# Patient Record
Sex: Male | Born: 1990 | Race: Black or African American | Hispanic: No | Marital: Single | Smoking: Current every day smoker
Health system: Southern US, Community
[De-identification: ages and names within clinical notes are randomized; demographics above are authoritative.]

---
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
## 2011-01-27 ENCOUNTER — Emergency Department (HOSPITAL_COMMUNITY): Payer: Self-pay

## 2011-01-27 ENCOUNTER — Emergency Department (HOSPITAL_COMMUNITY)
Admission: EM | Admit: 2011-01-27 | Discharge: 2011-01-28 | Disposition: A | Discharge status: Home / Self Care | Payer: Self-pay | Attending: Emergency Medicine | Admitting: Emergency Medicine

## 2011-01-27 DIAGNOSIS — J45909 Unspecified asthma, uncomplicated: Secondary | ICD-10-CM | POA: Insufficient documentation

## 2011-01-27 DIAGNOSIS — S0010XA Contusion of unspecified eyelid and periocular area, initial encounter: Secondary | ICD-10-CM | POA: Insufficient documentation

## 2011-01-27 DIAGNOSIS — R51 Headache: Secondary | ICD-10-CM | POA: Insufficient documentation

## 2011-01-27 DIAGNOSIS — R4182 Altered mental status, unspecified: Secondary | ICD-10-CM | POA: Insufficient documentation

## 2011-01-27 DIAGNOSIS — S060X9A Concussion with loss of consciousness of unspecified duration, initial encounter: Secondary | ICD-10-CM | POA: Insufficient documentation

## 2011-01-27 LAB — CBC
HCT: 40.9 % (ref 39.0–52.0)
MCV: 89.7 fL (ref 78.0–100.0)
Platelets: 196 10*3/uL (ref 150–400)
RBC: 4.56 MIL/uL (ref 4.22–5.81)
RDW: 12.7 % (ref 11.5–15.5)
WBC: 16.8 10*3/uL — ABNORMAL HIGH (ref 4.0–10.5)

## 2011-01-27 LAB — POCT I-STAT, CHEM 8
Chloride: 102 mEq/L (ref 96–112)
HCT: 44 % (ref 39.0–52.0)
Hemoglobin: 15 g/dL (ref 13.0–17.0)
Potassium: 3.8 mEq/L (ref 3.5–5.1)
Sodium: 139 mEq/L (ref 135–145)

## 2011-01-27 LAB — RAPID URINE DRUG SCREEN, HOSP PERFORMED
Amphetamines: NOT DETECTED
Barbiturates: NOT DETECTED
Cocaine: NOT DETECTED
Opiates: NOT DETECTED
Tetrahydrocannabinol: POSITIVE — AB

## 2011-01-27 LAB — DIFFERENTIAL
Basophils Absolute: 0 10*3/uL (ref 0.0–0.1)
Eosinophils Relative: 0 % (ref 0–5)
Lymphocytes Relative: 5 % — ABNORMAL LOW (ref 12–46)
Lymphs Abs: 0.9 10*3/uL (ref 0.7–4.0)
Neutrophils Relative %: 89 % — ABNORMAL HIGH (ref 43–77)

## 2011-04-24 ENCOUNTER — Emergency Department (HOSPITAL_COMMUNITY): Payer: Self-pay

## 2011-04-24 ENCOUNTER — Emergency Department (HOSPITAL_COMMUNITY)
Admission: EM | Admit: 2011-04-24 | Discharge: 2011-04-25 | Disposition: A | Discharge status: Home / Self Care | Payer: Self-pay | Attending: Emergency Medicine | Admitting: Emergency Medicine

## 2011-04-24 DIAGNOSIS — F911 Conduct disorder, childhood-onset type: Secondary | ICD-10-CM | POA: Insufficient documentation

## 2011-04-24 DIAGNOSIS — S62309A Unspecified fracture of unspecified metacarpal bone, initial encounter for closed fracture: Secondary | ICD-10-CM | POA: Insufficient documentation

## 2011-04-24 DIAGNOSIS — R443 Hallucinations, unspecified: Secondary | ICD-10-CM | POA: Insufficient documentation

## 2011-04-24 DIAGNOSIS — W2209XA Striking against other stationary object, initial encounter: Secondary | ICD-10-CM | POA: Insufficient documentation

## 2011-04-24 LAB — ETHANOL: Alcohol, Ethyl (B): <11 mg/dL (ref 0–11)

## 2011-04-24 LAB — COMPREHENSIVE METABOLIC PANEL
AST: 20 U/L (ref 0–37)
Albumin: 4.1 g/dL (ref 3.5–5.2)
BUN: 14 mg/dL (ref 6–23)
Calcium: 9.8 mg/dL (ref 8.4–10.5)
Creatinine, Ser: 0.76 mg/dL (ref 0.50–1.35)
Total Protein: 7.3 g/dL (ref 6.0–8.3)

## 2011-04-24 LAB — CBC
HCT: 41.7 % (ref 39.0–52.0)
MCHC: 35.5 g/dL (ref 30.0–36.0)
MCV: 88.9 fL (ref 78.0–100.0)
Platelets: 211 10*3/uL (ref 150–400)
RDW: 12.3 % (ref 11.5–15.5)

## 2011-04-24 LAB — RAPID URINE DRUG SCREEN, HOSP PERFORMED
Amphetamines: NOT DETECTED
Benzodiazepines: NOT DETECTED
Cocaine: NOT DETECTED
Opiates: NOT DETECTED

## 2011-04-24 LAB — DIFFERENTIAL
Eosinophils Absolute: 0.4 10*3/uL (ref 0.0–0.7)
Eosinophils Relative: 7 % — ABNORMAL HIGH (ref 0–5)
Lymphocytes Relative: 34 % (ref 12–46)
Lymphs Abs: 1.8 10*3/uL (ref 0.7–4.0)
Monocytes Absolute: 0.8 10*3/uL (ref 0.1–1.0)

## 2011-12-24 IMAGING — CT CT HEAD W/O CM
2 series · 16 of 30 positions shown, 18 images · non-contrast
Comparison: 05/08/2009

CLINICAL DATA: Trauma.  Confusion.  Hematoma about left eye.

CT HEAD WITHOUT CONTRAST
TECHNIQUE: Contiguous axial images were obtained from the base of
the skull through the vertex without contrast.

[Series 2: head w/o · axial · non-contrast · 0.49mm/px · z∈[+120,+240]mm · 8 of 32 slices shown, 10 images]
[im 4/32  brain]
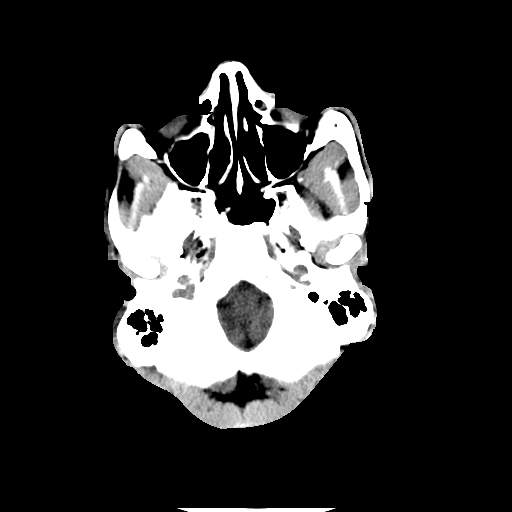
[im 4/32  bone]
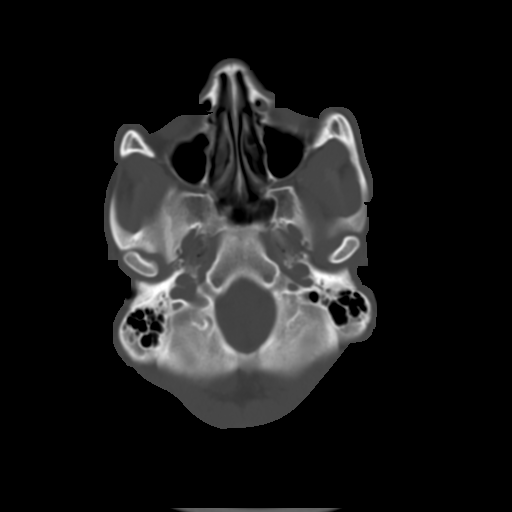
[im 7/32  brain]
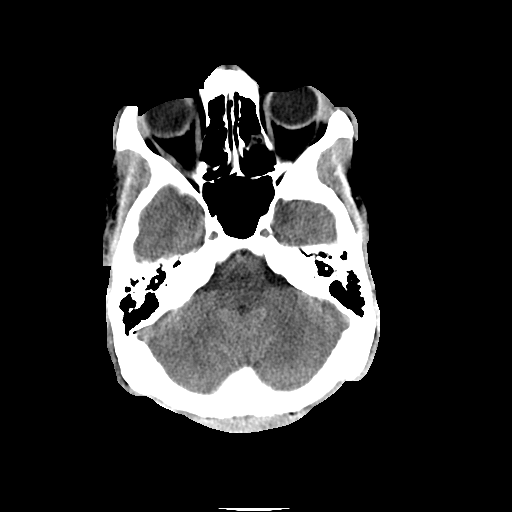
[im 11/32  brain]
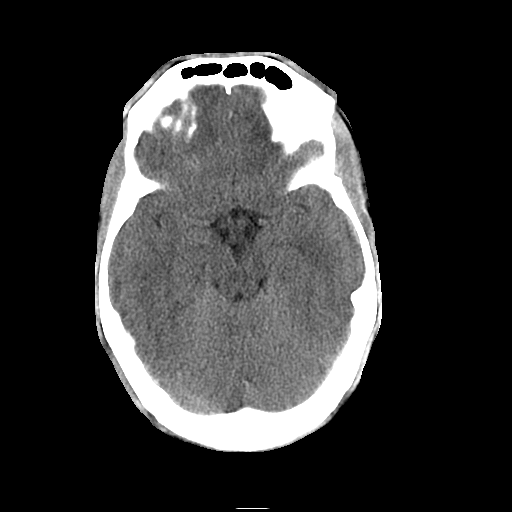
[im 14/32  brain]
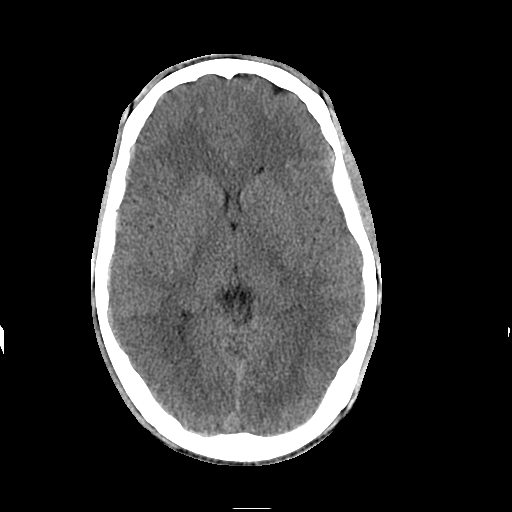
[im 18/32  brain]
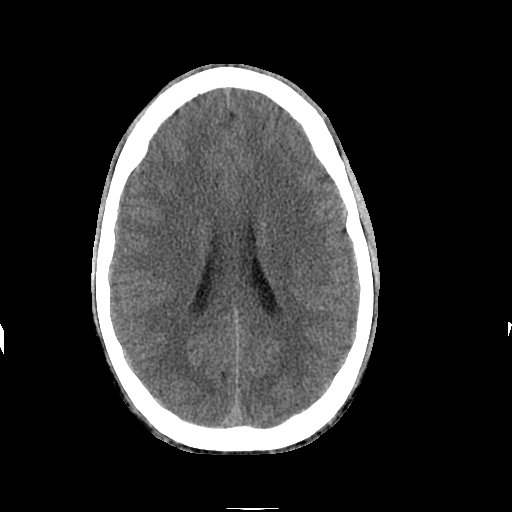
[im 18/32  bone]
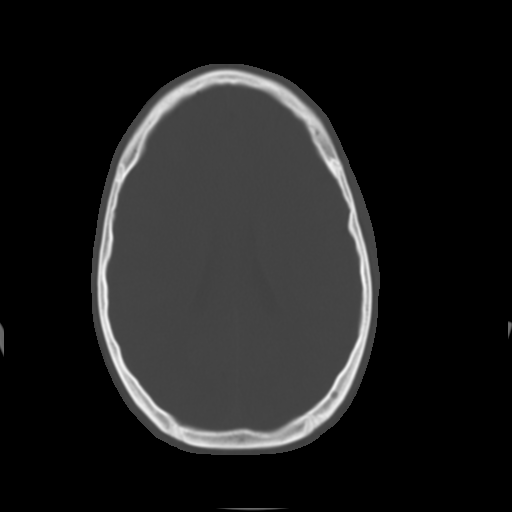
[im 21/32  brain]
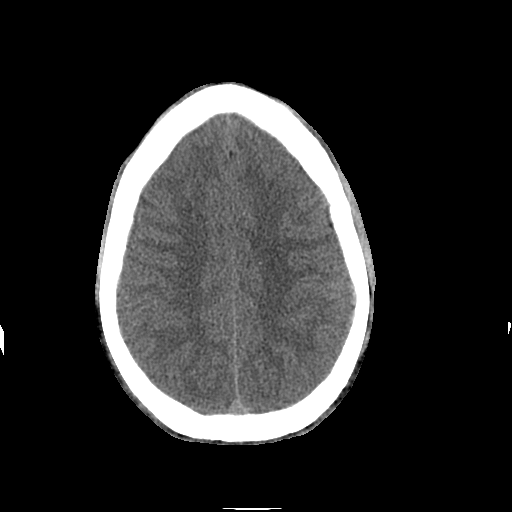
[im 25/32  brain]
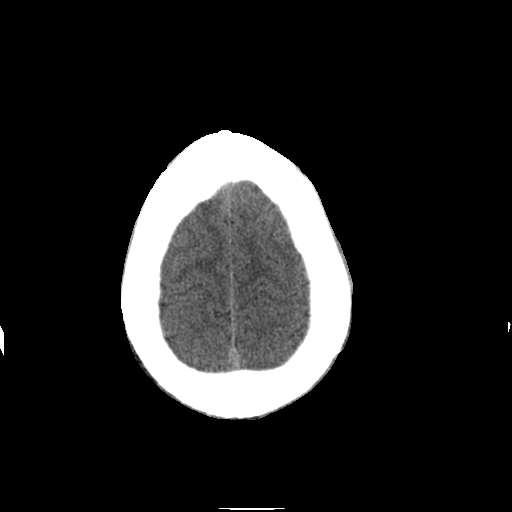
[im 28/32  brain]
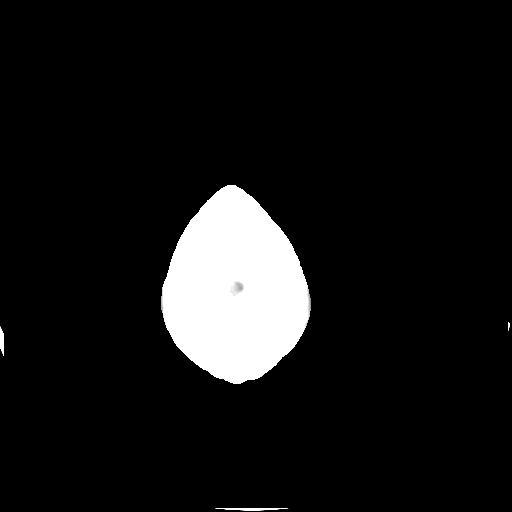

[Series 3: head w/o bone · axial · non-contrast · 0.49mm/px · z∈[+120,+242]mm · 8 of 63 slices shown]
[im 7/63  bone]
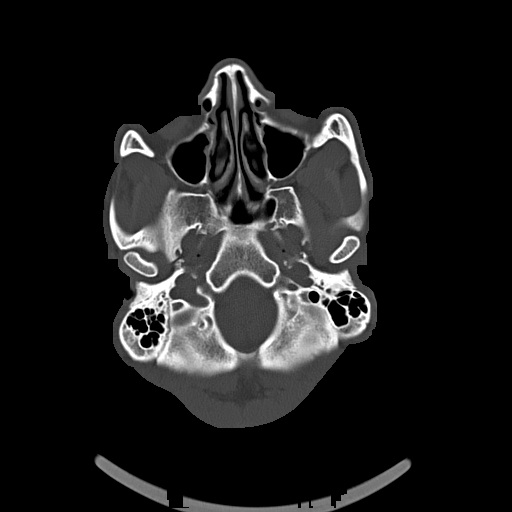
[im 14/63  bone]
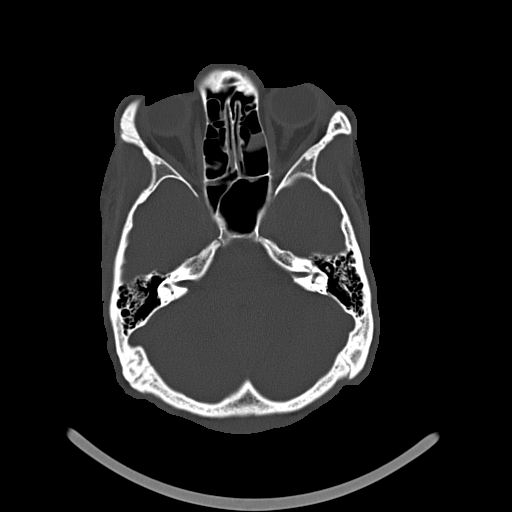
[im 20/63  bone]
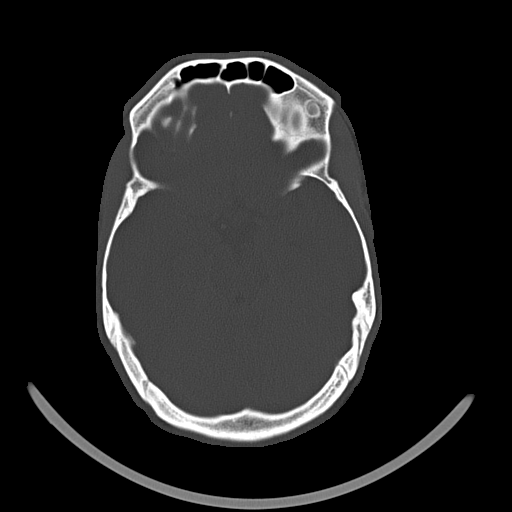
[im 27/63  bone]
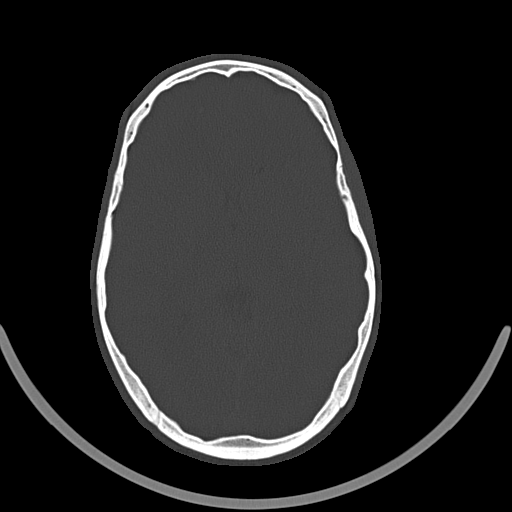
[im 36/63  bone]
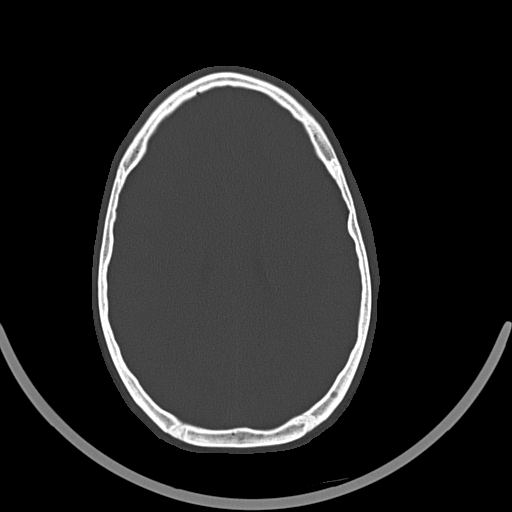
[im 43/63  bone]
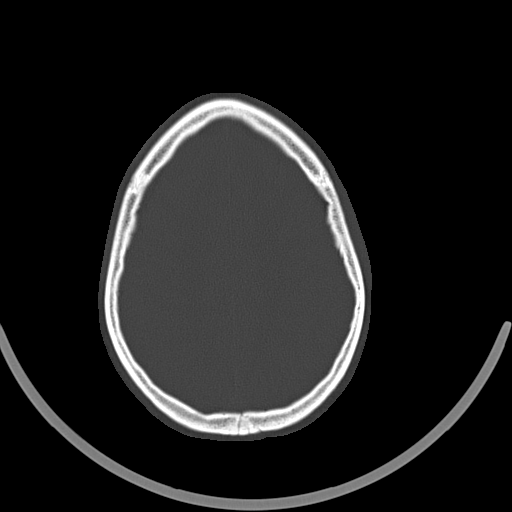
[im 49/63  bone]
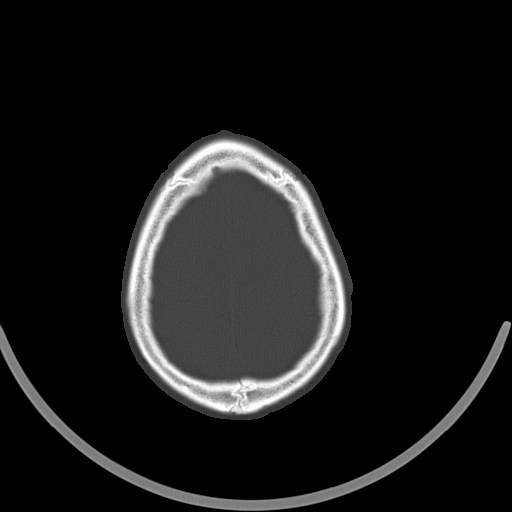
[im 56/63  bone]
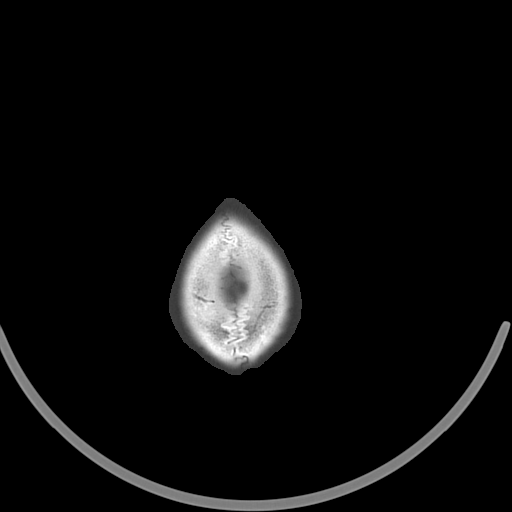

[16 of 30 positions shown; findings below may reference images not displayed]

FINDINGS: Bone windows demonstrate soft tissue swelling about the
left supraorbital region which is moderate.  There is mild left
frontal scalp soft tissue swelling as well.Right maxillary mucous
retention cyst or polyp.  Mild ethmoid air cell mucosal thickening.
Stable unaggressive appearing lesion about the superior wall of the
left orbit on image 20. No skull fracture.

Clear mastoid air cells.

Soft tissue windows demonstrate 2 mm focus of hyperattenuation in
the right frontal lobe on image 14 which is favored to be
artifactual.  No mass lesion, acute infarct, intra-axial, or extra-
axial fluid collection.
IMPRESSION: 1.  Left supraorbital and scalp soft tissue swelling.
2.  Favor artifactual hyperattenuation in the right frontal lobe.
Petechial focus of hemorrhage felt less likely.  If the patient has
ongoing or progressive symptoms, short-term follow-up CT should be
considered.
3.  Sinus disease.

## 2012-03-20 IMAGING — CR DG HAND COMPLETE 3+V*R*
3 series · 3 of 3 positions shown · non-contrast
Comparison: None.

CLINICAL DATA: Trauma.

RIGHT HAND - COMPLETE 3+ VIEW

[x hand ap right]
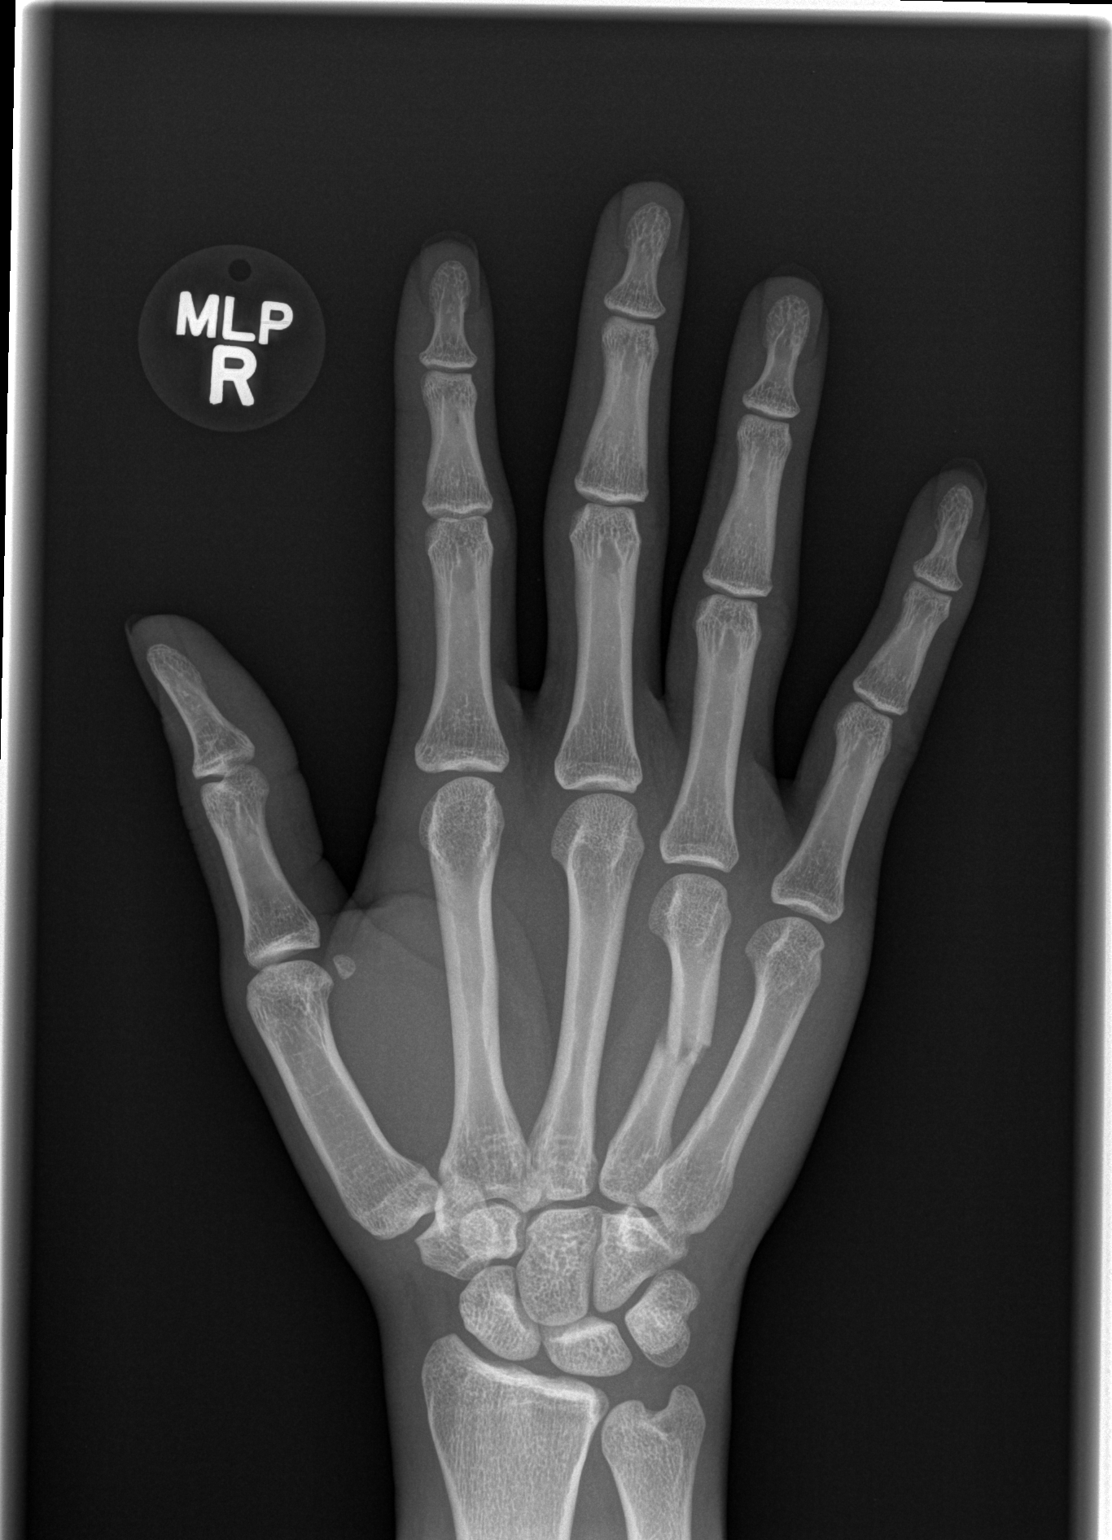

[x hand oblique right]
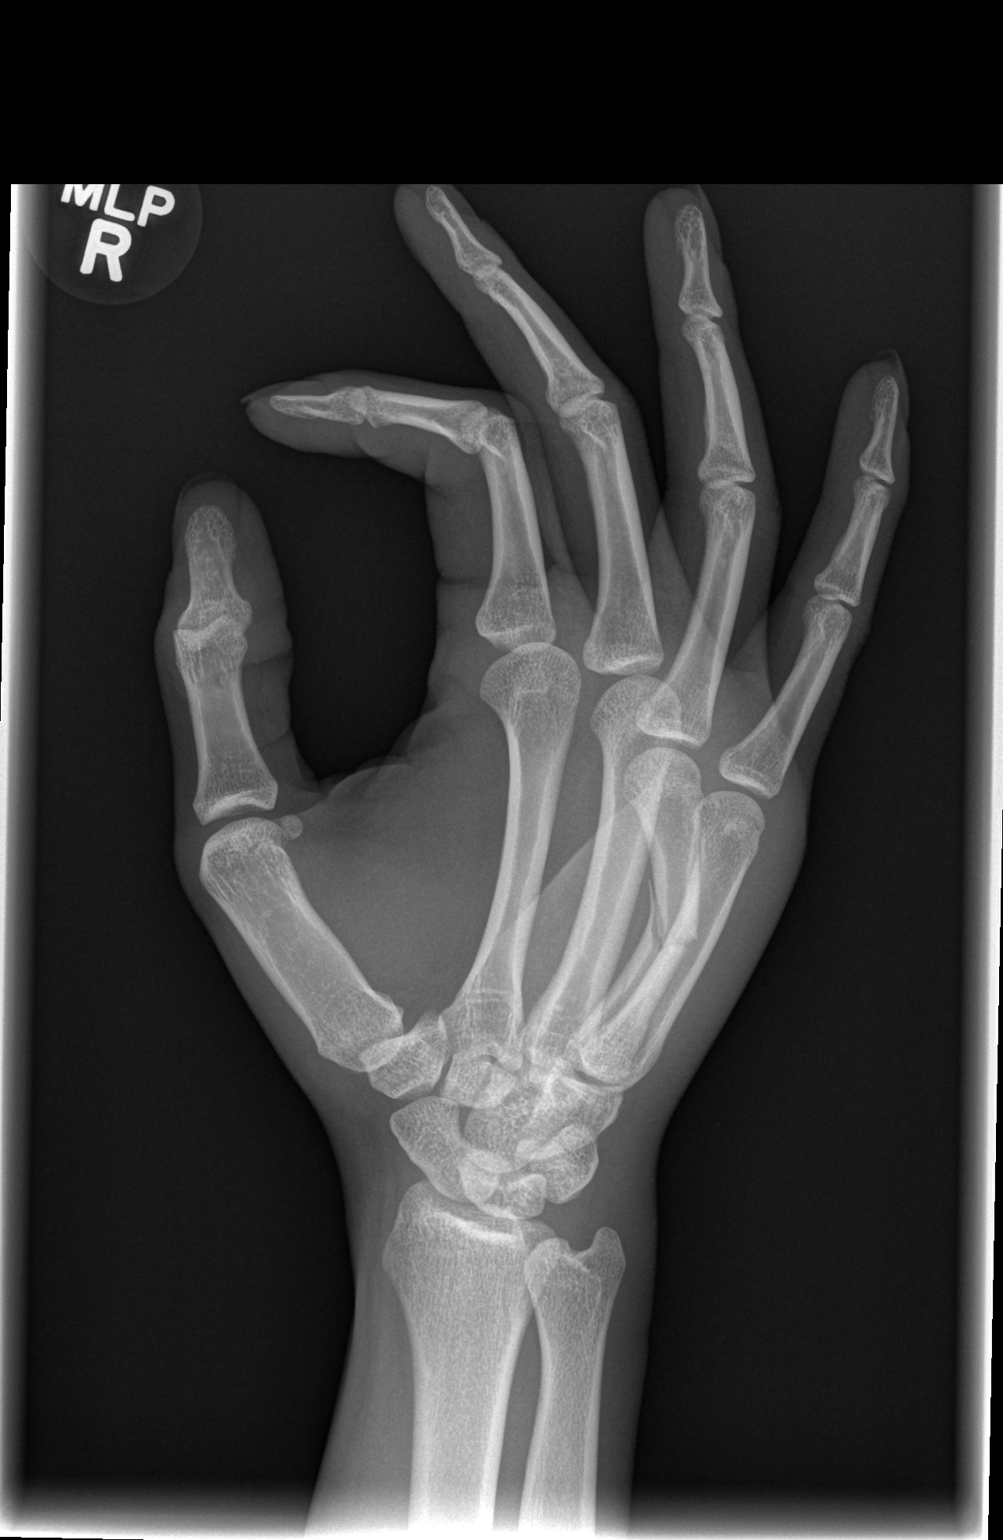

[x hand lat right]
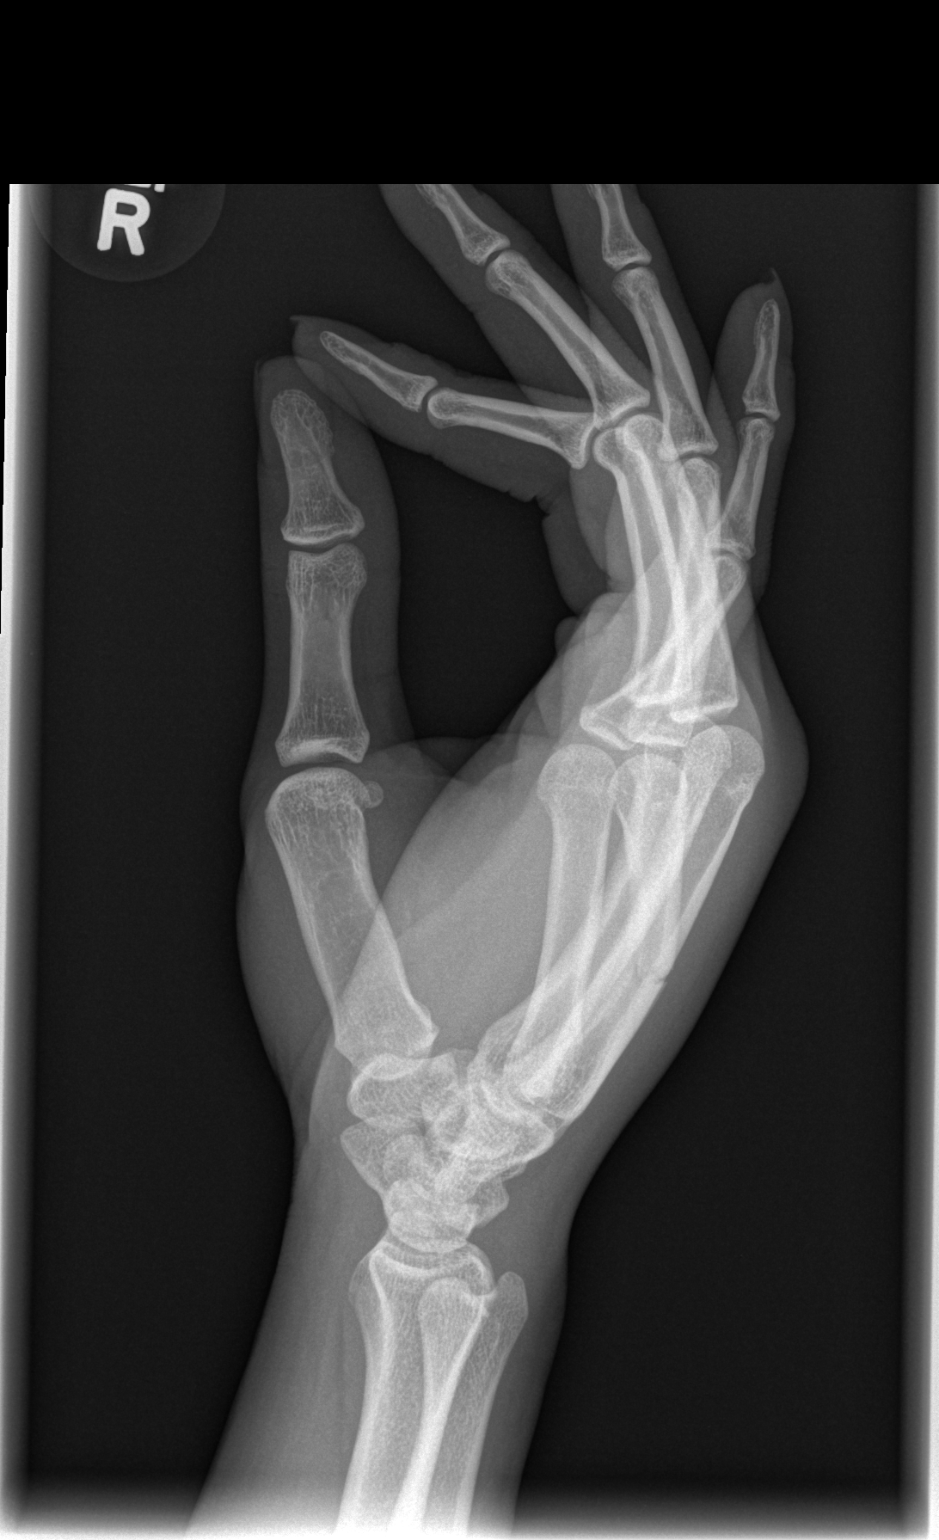

[3 of 3 positions shown; findings below may reference images not displayed]

FINDINGS: Fracture with angulation of the mid shaft right fourth
metacarpal.
IMPRESSION: Fracture with angulation of the mid shaft right fourth metacarpal.

## 2013-04-08 ENCOUNTER — Encounter (HOSPITAL_COMMUNITY): Payer: Self-pay | Admitting: Emergency Medicine

## 2013-04-08 ENCOUNTER — Emergency Department (INDEPENDENT_AMBULATORY_CARE_PROVIDER_SITE_OTHER)
Admission: EM | Admit: 2013-04-08 | Discharge: 2013-04-08 | Disposition: A | Discharge status: Home / Self Care | Payer: Medicaid Other | Source: Home / Self Care | Attending: Family Medicine | Admitting: Family Medicine

## 2013-04-08 DIAGNOSIS — L0231 Cutaneous abscess of buttock: Secondary | ICD-10-CM

## 2013-04-08 DIAGNOSIS — L03317 Cellulitis of buttock: Secondary | ICD-10-CM

## 2013-04-08 MED ORDER — SULFAMETHOXAZOLE-TRIMETHOPRIM 800-160 MG PO TABS: 1.0000 | ORAL_TABLET | Freq: Two times a day (BID) | ORAL | Status: DC

## 2013-04-08 NOTE — ED Provider Notes (Addendum)
  CSN: [id number]     Arrival date & time 04/08/13  1022 History     First MD Initiated Contact with Patient 04/08/13 1039     Chief Complaint  Patient presents with  . Recurrent Skin Infections   (Consider location/radiation/quality/duration/timing/severity/associated sxs/prior Treatment) Patient is a 22 y.o. male presenting with abscess. The history is provided by the patient.  Abscess Location:  Ano-genital Ano-genital abscess location:  L buttock Abscess quality: fluctuance and painful   Red streaking: no   Duration:  2 weeks Progression:  Worsening Pain details:    Quality:  Throbbing and tightness   Severity:  Moderate Chronicity:  New   History reviewed. No pertinent past medical history. No past surgical history on file. No family history on file. History  Substance Use Topics  . Smoking status: Not on file  . Smokeless tobacco: Not on file  . Alcohol Use: Not on file    Review of Systems  Constitutional: Negative.   Gastrointestinal: Negative.   Skin: Positive for rash.    Allergies  Review of patient's allergies indicates no known allergies.  Home Medications   Current Outpatient Rx  Name  Route  Sig  Dispense  Refill  . sulfamethoxazole-trimethoprim (BACTRIM DS,SEPTRA DS) 800-160 MG per tablet   Oral   Take 1 tablet by mouth 2 (two) times daily.   20 tablet   0    BP 147/84  Pulse 62  Temp(Src) 98 F (36.7 C) (Oral)  Resp 14  SpO2 100% Physical Exam  Nursing note and vitals reviewed. Constitutional: He is oriented to person, place, and time. He appears well-developed and well-nourished.  Genitourinary: Rectum normal.  Neurological: He is alert and oriented to person, place, and time.  Skin: Skin is warm and dry.  Left gluteal abscess     ED Course   INCISION AND DRAINAGE Date/Time: 04/08/2013 10:59 AM Performed by: Linna Hoff Authorized by: Bradd Canary D Consent: Verbal consent obtained. Risks and benefits: risks, benefits and  alternatives were discussed Consent given by: patient Type: abscess Body area: anogenital Location details: gluteal cleft Local anesthetic: topical anesthetic Patient sedated: no Scalpel size: 11 Incision type: single straight Complexity: simple Drainage: purulent Drainage amount: copious Wound treatment: wound left open Patient tolerance: Patient tolerated the procedure well with no immediate complications. Comments: Culture obtained.   (including critical care time)  Labs Reviewed  CULTURE, ROUTINE-ABSCESS   No results found. 1. Abscess of buttock, left     MDM  I+d performed.  Linna Hoff, MD 04/08/13 1103  Linna Hoff, MD 04/12/13 1007

## 2013-04-08 NOTE — ED Notes (Signed)
C/o boil on right buttocks since two weeks.  Patient thought it was going to go away.  Patient states he did place hot water on it.

## 2013-04-11 LAB — CULTURE, ROUTINE-ABSCESS: Culture: NO GROWTH

## 2013-08-24 ENCOUNTER — Emergency Department (INDEPENDENT_AMBULATORY_CARE_PROVIDER_SITE_OTHER)
Admission: EM | Admit: 2013-08-24 | Discharge: 2013-08-24 | Disposition: A | Discharge status: Home / Self Care | Payer: Medicaid Other | Source: Home / Self Care | Attending: Emergency Medicine | Admitting: Emergency Medicine

## 2013-08-24 ENCOUNTER — Encounter (HOSPITAL_COMMUNITY): Payer: Self-pay | Admitting: Emergency Medicine

## 2013-08-24 DIAGNOSIS — K610 Anal abscess: Secondary | ICD-10-CM

## 2013-08-24 DIAGNOSIS — K612 Anorectal abscess: Secondary | ICD-10-CM

## 2013-08-24 MED ORDER — HYDROCODONE-ACETAMINOPHEN 5-325 MG PO TABS: ORAL_TABLET | ORAL | Status: DC

## 2013-08-24 MED ORDER — DOXYCYCLINE HYCLATE 100 MG PO TABS: 100.0000 mg | ORAL_TABLET | Freq: Two times a day (BID) | ORAL | Status: DC

## 2013-08-24 NOTE — ED Notes (Signed)
Call back number verified.  

## 2013-08-24 NOTE — ED Notes (Signed)
Pt c/o abscess on right glut onset 2 weeks... Reports it's very tender Denies: drainage, f/v/n/d He is alert w/no signs of acute distress.

## 2013-08-24 NOTE — ED Provider Notes (Signed)
Chief Complaint:   Chief Complaint  Patient presents with  . Abscess    History of Present Illness:    William Torres is a 22 year old male who has a painful abscess on his right buttock just adjacent to the anus for the past 2 weeks. This has not been draining anything had no fever. He has had a perianal abscess in the past on the left side which was drained here.  Review of Systems:  Other than noted above, the patient denies any of the following symptoms: Systemic:  No fever, chills or sweats. Skin:  No rash or itching.  PMFSH:  Past medical history, family history, social history, meds, and allergies were reviewed.  No history of diabetes or prior history of MRSA.  Physical Exam:   Vital signs:  BP 145/102  Pulse 115  Temp(Src) 98.6 F (37 C) (Oral)  Resp 20  SpO2 99% Skin:  There is a large, 4 x 4 centimeter raised, tender, fluctuant area on the right buttock just adjacent to the anus.  Skin exam was otherwise normal.  No rash. Ext:  Distal pulses were full, patient has full ROM of all joints.  Procedure:  Verbal informed consent was obtained.  The patient was informed of the risks and benefits of the procedure and understands and accepts.  Identity of the patient was verified verbally and by wristband.   The abscess area described above was prepped with Betadine and alcohol and anesthetized with 3 mL of 2% Xylocaine with epinephrine.  Using a #11 scalpel blade, a singe straight incision was made into the area of fluctulence, yielding a large amount of malodorous, prurulent drainage.  Routine cultures were obtained.  Blunt dissection was used to break up loculations and the resulting wound cavity was packed with 1/4 inch Iodoform gauze.  A sterile pressure dressing was applied.  Assessment:  The encounter diagnosis was Anal abscess.  Plan:   1.  Meds:  The following meds were prescribed:   Discharge Medication List as of 08/24/2013  4:25 PM    START taking these medications    Details  doxycycline (VIBRA-TABS) 100 MG tablet Take 1 tablet (100 mg total) by mouth 2 (two) times daily., Starting 08/24/2013, Until Discontinued, Normal    HYDROcodone-acetaminophen (NORCO/VICODIN) 5-325 MG per tablet 1 to 2 tabs every 4 to 6 hours as needed for pain., Print        2.  Patient Education/Counseling:  The patient was given appropriate handouts, self care instructions, and instructed in symptomatic relief.   3.  Follow up:  The patient was instructed to leave the dressing in place and return again in 48 hours for packing removal, if becoming worse in any way, and given some red flag symptoms such as fever which would prompt immediate return.  Follow up here in 48 hours.     Reuben Likes, MD 08/24/13 [phone]

## 2013-08-28 LAB — CULTURE, ROUTINE-ABSCESS

## 2014-06-29 ENCOUNTER — Encounter (HOSPITAL_COMMUNITY): Payer: Self-pay | Admitting: Emergency Medicine

## 2014-06-29 ENCOUNTER — Emergency Department (INDEPENDENT_AMBULATORY_CARE_PROVIDER_SITE_OTHER)
Admission: EM | Admit: 2014-06-29 | Discharge: 2014-06-29 | Disposition: A | Discharge status: Home / Self Care | Payer: Medicaid Other | Source: Home / Self Care | Attending: Emergency Medicine | Admitting: Emergency Medicine

## 2014-06-29 DIAGNOSIS — L0291 Cutaneous abscess, unspecified: Secondary | ICD-10-CM

## 2014-06-29 MED ORDER — LIDOCAINE-EPINEPHRINE (PF) 2 %-1:200000 IJ SOLN
INTRAMUSCULAR | Status: AC
  Filled 2014-06-29: qty 20

## 2014-06-29 MED ORDER — HYDROCODONE-ACETAMINOPHEN 5-325 MG PO TABS: ORAL_TABLET | ORAL | Status: DC

## 2014-06-29 MED ORDER — SULFAMETHOXAZOLE-TRIMETHOPRIM 800-160 MG PO TABS
2.0000 | ORAL_TABLET | Freq: Two times a day (BID) | ORAL | Status: DC
Start: 2014-06-29 — End: 2014-12-09

## 2014-06-29 NOTE — ED Notes (Signed)
History of the same, abscess to groin area .  Noticed yesterday, has increased in size significantly since yesterday

## 2014-06-29 NOTE — ED Provider Notes (Signed)
  Chief Complaint   Abscess   History of Present Illness   William Torres is a 23 year old male who presents with a 2 day history of a painful, swollen abscess in his left groin and left scrotal area. This is not draining any pus. He feels weak, dizzy, and rundown, but denies any fever. He has had boils on his back in the past. No history of MRSA or diabetes.  Review of Systems   Other than as noted above, the patient denies any of the following symptoms: Systemic:  No fever, chills or sweats. Skin:  No rash or itching.  PMFSH   Past medical history, family history, social history, meds, and allergies were reviewed. He is allergic to penicillin. He takes albuterol for asthma.  Physical Examination     Vital signs:  BP 162/91 mmHg  Pulse 71  Temp(Src) 100.1 F (37.8 C) (Oral)  Resp 16 Skin:  There is a 1.5 cm raised, tender, fluctuant abscess on the left hemiscrotum.  There was no crepitus, necrosis, ecchymosis, or herrhagic bullae. Skin exam was otherwise normal.  No rash. Ext:  Distal pulses were full, patient has full ROM of all joints.  Procedure Note:  Verbal informed consent was obtained.  The patient was informed of the risks and benefits of the procedure and understands and accepts.  A time out was called and the identity of the patient and correct procedure was confirmed.   The abscess area described above was prepped with Betadine and alcohol and anesthetized with 3 mL of 2% Xylocaine with epinephrine.  Using a #11 scalpel blade, a singe straight incision was made into the area of fluctulence, yielding a moderate amount of prurulent drainage.  Routine cultures were obtained.  Blunt dissection was used to break up loculations and the resulting wound cavity was packed with 1/4 inch Iodoform gauze.  A sterile pressure dressing was applied.   Assessment   The encounter diagnosis was Abscess.  Plan     1.  Meds:  The following meds were prescribed:   Discharge  Medication List as of 06/29/2014  2:10 PM     He was given prescriptions for Septra DS, #40, 2 twice a day for 10 days, and Norco 5/325 one to 2 every 4-6 hours as needed for pain.  2.  Patient Education/Counseling:  The patient was given appropriate handouts, wound care instructions, and instructed in pain control.    3.  Follow up:  The patient was instructed to leave the dressing in place and return here again in 48 hours for packing removal, or sooner if becoming worse in any way, and given some red flag symptoms such as fever which would prompt immediate return.       Reuben Likesavid C Manual Navarra, MD 06/29/14 [phone]

## 2014-06-29 NOTE — Discharge Instructions (Signed)

## 2014-07-03 LAB — CULTURE, ROUTINE-ABSCESS

## 2014-12-09 ENCOUNTER — Encounter (HOSPITAL_COMMUNITY): Payer: Self-pay | Admitting: Emergency Medicine

## 2014-12-09 ENCOUNTER — Emergency Department (INDEPENDENT_AMBULATORY_CARE_PROVIDER_SITE_OTHER)
Admission: EM | Admit: 2014-12-09 | Discharge: 2014-12-09 | Disposition: A | Discharge status: Home / Self Care | Payer: Medicaid Other | Source: Home / Self Care | Attending: Emergency Medicine | Admitting: Emergency Medicine

## 2014-12-09 DIAGNOSIS — L0233 Carbuncle of buttock: Secondary | ICD-10-CM | POA: Diagnosis not present

## 2014-12-09 DIAGNOSIS — L0232 Furuncle of buttock: Secondary | ICD-10-CM

## 2014-12-09 NOTE — ED Notes (Signed)
Pt noticed an abscess on his left lower buttocks about one week ago.  He reports it ruptured in the shower, on its own two days ago.  He denies any pain, fever, or redness or swelling since the rupture.  He is here today to see if he needs to be on any antibiotics.

## 2014-12-09 NOTE — Discharge Instructions (Signed)
The boil is healing well. There is no sign of infection. Continue warm compresses for the next 2 days. Follow-up as needed.

## 2014-12-09 NOTE — ED Provider Notes (Signed)
CSN: [id number]     Arrival date & time 12/09/14  1109 History   First MD Initiated Contact with Patient 12/09/14 1143     Chief Complaint  Patient presents with  . Abscess   (Consider location/radiation/quality/duration/timing/severity/associated sxs/prior Treatment) HPI  He is a 24 year old man here for evaluation of left buttock abscess. He states he noticed it about a week ago. He is been applying warm compresses. 2 days ago it ruptured spontaneously. He states the pain is basically gone. No fevers. He wants to make sure he does not need an antibiotic.  History reviewed. No pertinent past medical history. History reviewed. No pertinent past surgical history. History reviewed. No pertinent family history. History  Substance Use Topics  . Smoking status: Current Every Day Smoker -- 0.33 packs/day    Types: Cigarettes  . Smokeless tobacco: Never Used  . Alcohol Use: No    Review of Systems  Constitutional: Negative for fever.  Gastrointestinal: Negative for nausea.  Skin: Positive for wound (boil).    Allergies  Penicillins  Home Medications   Prior to Admission medications   Medication Sig Start Date End Date Taking? Authorizing Provider  doxycycline (VIBRA-TABS) 100 MG tablet Take 1 tablet (100 mg total) by mouth 2 (two) times daily. 08/24/13   Reuben Likesavid C Keller, MD  HYDROcodone-acetaminophen (NORCO/VICODIN) 5-325 MG per tablet 1 to 2 tabs every 4 to 6 hours as needed for pain. 06/29/14   Reuben Likesavid C Keller, MD  sulfamethoxazole-trimethoprim (BACTRIM DS,SEPTRA DS) 800-160 MG per tablet Take 2 tablets by mouth 2 (two) times daily. 06/29/14   Reuben Likesavid C Keller, MD   BP 130/76 mmHg  Pulse 68  Temp(Src) 97.7 F (36.5 C) (Oral)  Resp 18  SpO2 100% Physical Exam  Constitutional: He is oriented to person, place, and time. He appears well-developed. No distress.  Cardiovascular: Normal rate.   Pulmonary/Chest: Effort normal.  Neurological: He is alert and oriented to person, place,  and time.  Skin:  3 mm nodule on left buttock. No fluctuance. No erythema or tenderness.    ED Course  Procedures (including critical care time) Labs Review Labs Reviewed - No data to display  Imaging Review No results found.   MDM   1. Boil, buttock    This has ruptured spontaneously and is healing well. There is no sign of infection. Recommend continued warm compresses for the next 2 days. Follow-up as needed.    Charm RingsErin J Shandra Szymborski, MD 12/09/14 [phone]

## 2014-12-30 ENCOUNTER — Encounter (HOSPITAL_COMMUNITY): Payer: Self-pay

## 2014-12-30 ENCOUNTER — Emergency Department (INDEPENDENT_AMBULATORY_CARE_PROVIDER_SITE_OTHER)
Admission: EM | Admit: 2014-12-30 | Discharge: 2014-12-30 | Disposition: A | Discharge status: Home / Self Care | Payer: Self-pay | Source: Home / Self Care | Attending: Family Medicine | Admitting: Family Medicine

## 2014-12-30 ENCOUNTER — Other Ambulatory Visit (HOSPITAL_COMMUNITY)
Admission: RE | Admit: 2014-12-30 | Discharge: 2014-12-30 | Disposition: A | Discharge status: Home / Self Care | Payer: Medicaid Other | Source: Ambulatory Visit | Attending: Family Medicine | Admitting: Family Medicine

## 2014-12-30 DIAGNOSIS — Z113 Encounter for screening for infections with a predominantly sexual mode of transmission: Secondary | ICD-10-CM

## 2014-12-30 DIAGNOSIS — L739 Follicular disorder, unspecified: Secondary | ICD-10-CM

## 2014-12-30 MED ORDER — SULFAMETHOXAZOLE-TRIMETHOPRIM 800-160 MG PO TABS: 2.0000 | ORAL_TABLET | Freq: Two times a day (BID) | ORAL | Status: AC

## 2014-12-30 NOTE — ED Provider Notes (Signed)
William Torres is a 24 y.o. male who presents to Urgent Care today for abscess. Patient has a painful swollen area in his left inguinal area present for about 3 days worsening. No fevers or chills vomiting or diarrhea. He's tried warm compresses. He feels well otherwise. While he is here he would like to be screened for STDs. He is currently asymptomatic.   History reviewed. No pertinent past medical history. History reviewed. No pertinent past surgical history. History  Substance Use Topics  . Smoking status: Current Every Day Smoker -- 0.33 packs/day    Types: Cigarettes  . Smokeless tobacco: Never Used  . Alcohol Use: No   ROS as above Medications: No current facility-administered medications for this encounter.   Current Outpatient Prescriptions  Medication Sig Dispense Refill  . sulfamethoxazole-trimethoprim (BACTRIM DS,SEPTRA DS) 800-160 MG per tablet Take 2 tablets by mouth 2 (two) times daily. 28 tablet 0   Allergies  Allergen Reactions  . Penicillins      Exam:  BP 134/83 mmHg  Pulse 77  Temp(Src) 99.1 F (37.3 C) (Oral)  Resp 16  SpO2 100% Gen: Well NAD HEENT: EOMI,  MMM Lungs: Normal work of breathing. CTABL Heart: RRR no MRG Abd: NABS, Soft. Nondistended, Nontender Exts: Brisk capillary refill, warm and well perfused.  Skin: Tender erythematous indurated area left pubic mound without fluctuance. Approximately 1 cm. Genitals: Normal external genitalia. No inguinal lymphadenopathy. Penis is circumcised without lesions or discharge. Testicles are descended bilaterally and nontender.   No results found for this or any previous visit (from the past 24 hour(s)). No results found.  Assessment and Plan: 24 y.o. male with  1) folliculitis versus early abscess. Not yet drainable. Treat with oral Bactrim. Return if developing fluctuance. 2) screening for STD. Urine cytology for gonorrhea Chlamydia Trichomonas as well as serology for HIV and syphilis  pending.  Discussed warning signs or symptoms. Please see discharge instructions. Patient expresses understanding.     Rodolph BongEvan S Lakin Rhine, MD 12/30/14 1050

## 2014-12-30 NOTE — ED Notes (Signed)
Concern about swollen place on groin

## 2014-12-30 NOTE — Discharge Instructions (Signed)
Thank you for coming in today. I will call you if anything comes up with your tests.  Take bactrim twice daily.  Return if the abscess gets worse.    Folliculitis  Folliculitis is redness, soreness, and swelling (inflammation) of the hair follicles. This condition can occur anywhere on the body. People with weakened immune systems, diabetes, or obesity have a greater risk of getting folliculitis. CAUSES  Bacterial infection. This is the most common cause.  Fungal infection.  Viral infection.  Contact with certain chemicals, especially oils and tars. Long-term folliculitis can result from bacteria that live in the nostrils. The bacteria may trigger multiple outbreaks of folliculitis over time. SYMPTOMS Folliculitis most commonly occurs on the scalp, thighs, legs, back, buttocks, and areas where hair is shaved frequently. An early sign of folliculitis is a small, white or yellow, pus-filled, itchy lesion (pustule). These lesions appear on a red, inflamed follicle. They are usually less than 0.2 inches (5 mm) wide. When there is an infection of the follicle that goes deeper, it becomes a boil or furuncle. A group of closely packed boils creates a larger lesion (carbuncle). Carbuncles tend to occur in hairy, sweaty areas of the body. DIAGNOSIS  Your caregiver can usually tell what is wrong by doing a physical exam. A sample may be taken from one of the lesions and tested in a lab. This can help determine what is causing your folliculitis. TREATMENT  Treatment may include:  Applying warm compresses to the affected areas.  Taking antibiotic medicines orally or applying them to the skin.  Draining the lesions if they contain a large amount of pus or fluid.  Laser hair removal for cases of long-lasting folliculitis. This helps to prevent regrowth of the hair. HOME CARE INSTRUCTIONS  Apply warm compresses to the affected areas as directed by your caregiver.  If antibiotics are prescribed,  take them as directed. Finish them even if you start to feel better.  You may take over-the-counter medicines to relieve itching.  Do not shave irritated skin.  Follow up with your caregiver as directed. SEEK IMMEDIATE MEDICAL CARE IF:   You have increasing redness, swelling, or pain in the affected area.  You have a fever. MAKE SURE YOU:  Understand these instructions.  Will watch your condition.  Will get help right away if you are not doing well or get worse. Document Released: 10/23/2001 Document Revised: 02/13/2012 Document Reviewed: 11/14/2011 [facility] Patient Information 2015 [place], [place]. This information is not intended to replace advice given to you by your health care provider. Make sure you discuss any questions you have with your health care provider.

## 2014-12-31 ENCOUNTER — Encounter (HOSPITAL_COMMUNITY): Payer: Self-pay | Admitting: Emergency Medicine

## 2014-12-31 ENCOUNTER — Emergency Department (INDEPENDENT_AMBULATORY_CARE_PROVIDER_SITE_OTHER)
Admission: EM | Admit: 2014-12-31 | Discharge: 2014-12-31 | Disposition: A | Discharge status: Home / Self Care | Payer: Medicaid Other | Source: Home / Self Care | Attending: Family Medicine | Admitting: Family Medicine

## 2014-12-31 DIAGNOSIS — L0291 Cutaneous abscess, unspecified: Secondary | ICD-10-CM | POA: Diagnosis not present

## 2014-12-31 LAB — URINE CYTOLOGY ANCILLARY ONLY
CHLAMYDIA, DNA PROBE: NEGATIVE
NEISSERIA GONORRHEA: NEGATIVE
TRICH (WINDOWPATH): NEGATIVE

## 2014-12-31 LAB — HIV ANTIBODY (ROUTINE TESTING W REFLEX): HIV SCREEN 4TH GENERATION: NONREACTIVE

## 2014-12-31 LAB — RPR: RPR: NONREACTIVE

## 2014-12-31 NOTE — ED Notes (Signed)
Reports abscess, seen for the same.  Reports pain is increasing.

## 2014-12-31 NOTE — ED Provider Notes (Signed)
William KeelsHaywood W Torres is a 24 y.o. male who presents to Urgent Care today for left inguinal abscess. Patient was seen 2 days ago for abscess in his left inguinal region. At that time was not quite ready for drainage. He was treated with Bactrim which has not worked. His symptoms have worsened. No fevers or chills vomiting or diarrhea.   History reviewed. No pertinent past medical history. History reviewed. No pertinent past surgical history. History  Substance Use Topics  . Smoking status: Current Every Day Smoker -- 0.33 packs/day    Types: Cigarettes  . Smokeless tobacco: Never Used  . Alcohol Use: No   ROS as above Medications: No current facility-administered medications for this encounter.   Current Outpatient Prescriptions  Medication Sig Dispense Refill  . sulfamethoxazole-trimethoprim (BACTRIM DS,SEPTRA DS) 800-160 MG per tablet Take 2 tablets by mouth 2 (two) times daily. 28 tablet 0   Allergies  Allergen Reactions  . Penicillins      Exam:  BP 132/81 mmHg  Pulse 85  Temp(Src) 97.4 F (36.3 C) (Oral)  Resp 16  SpO2 100% Gen: Well NAD Skin: Erythematous tender fluctuant area left inguinal region .  Abscess incision and drainage. Consent obtained and timeout performed. Skin cleaned with alcohol, and cold spray applied. 2.5 mL of lidocaine with epi injected achieving good anesthesia. Skin was again cleaned with alcohol. A sharp incision was made to the area of fluctuance. The incision was widened and pus was expressed. Pus was cultured. Blunt dissection was used to break up loculations. Further pus was expressed. Patient tolerated the procedure well. A dressing was applied    No results found for this or any previous visit (from the past 24 hour(s)). No results found.  Assessment and Plan: 24 y.o. male with abscess. Status post incision and drainage continue Bactrim return as needed.  Discussed warning signs or symptoms. Please see discharge instructions.  Patient expresses understanding.     Rodolph BongEvan S Merritt Kibby, MD 12/31/14 [phone]

## 2014-12-31 NOTE — Discharge Instructions (Signed)
Thank you for coming in today. Continue bactrim.  Return as needed.    Abscess Care After An abscess (also called a boil or furuncle) is an infected area that contains a collection of pus. Signs and symptoms of an abscess include pain, tenderness, redness, or hardness, or you may feel a moveable soft area under your skin. An abscess can occur anywhere in the body. The infection may spread to surrounding tissues causing cellulitis. A cut (incision) by the surgeon was made over your abscess and the pus was drained out. Gauze may have been packed into the space to provide a drain that will allow the cavity to heal from the inside outwards. The boil may be painful for 5 to 7 days. Most people with a boil do not have high fevers. Your abscess, if seen early, may not have localized, and may not have been lanced. If not, another appointment may be required for this if it does not get better on its own or with medications. HOME CARE INSTRUCTIONS   Only take over-the-counter or prescription medicines for pain, discomfort, or fever as directed by your caregiver.  When you bathe, soak and then remove gauze or iodoform packs at least daily or as directed by your caregiver. You may then wash the wound gently with mild soapy water. Repack with gauze or do as your caregiver directs. SEEK IMMEDIATE MEDICAL CARE IF:   You develop increased pain, swelling, redness, drainage, or bleeding in the wound site.  You develop signs of generalized infection including muscle aches, chills, fever, or a general ill feeling.  An oral temperature above 102 F (38.9 C) develops, not controlled by medication. See your caregiver for a recheck if you develop any of the symptoms described above. If medications (antibiotics) were prescribed, take them as directed. Document Released: 03/02/2005 Document Revised: 11/06/2011 Document Reviewed: 10/28/2007 [facility] Patient Information 2015 [place], [place]. This information is not  intended to replace advice given to you by your health care provider. Make sure you discuss any questions you have with your health care provider.

## 2015-01-13 NOTE — ED Notes (Signed)
Presented to check on lab reports. Advised al reports are negative. Ad[facility] , and to continue to practice safer sex
